# Patient Record
Sex: Male | Born: 1990 | Race: White | Hispanic: No | Marital: Single | Smoking: Never smoker
Health system: Southern US, Community
[De-identification: ages and names within clinical notes are randomized; demographics above are authoritative.]

## PROBLEM LIST (undated history)

## (undated) DIAGNOSIS — S8990XA Unspecified injury of unspecified lower leg, initial encounter: Secondary | ICD-10-CM

## (undated) DIAGNOSIS — F988 Other specified behavioral and emotional disorders with onset usually occurring in childhood and adolescence: Secondary | ICD-10-CM

## (undated) HISTORY — DX: Unspecified injury of unspecified lower leg, initial encounter: S89.90XA

---

## 2012-08-13 ENCOUNTER — Emergency Department: Payer: Self-pay | Admitting: Emergency Medicine

## 2014-12-23 ENCOUNTER — Telehealth: Payer: Self-pay | Admitting: Family Medicine
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
## 2014-12-23 NOTE — Telephone Encounter (Signed)
Add him on.  if possible. Thanks.

## 2014-12-23 NOTE — Telephone Encounter (Signed)
Scheduled 12/26/2014 @ 11:30 a.m.

## 2014-12-26 ENCOUNTER — Ambulatory Visit (INDEPENDENT_AMBULATORY_CARE_PROVIDER_SITE_OTHER): Payer: 59 | Admitting: Family Medicine

## 2014-12-26 ENCOUNTER — Encounter (INDEPENDENT_AMBULATORY_CARE_PROVIDER_SITE_OTHER): Payer: Self-pay

## 2014-12-26 ENCOUNTER — Encounter: Payer: Self-pay | Admitting: Family Medicine

## 2014-12-26 VITALS — BP 102/70 | HR 83 | Temp 99.1°F | Ht 74.0 in | Wt 241.8 lb

## 2014-12-26 DIAGNOSIS — S81812D Laceration without foreign body, left lower leg, subsequent encounter: Secondary | ICD-10-CM
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
## 2014-12-26 NOTE — Patient Instructions (Signed)
Change dressing daily.  Recheck at the end of next week- suture removal.  If any fevers, draining pus, more pain, then notify us.   Take care.  Glad to see you.

## 2014-12-27 ENCOUNTER — Encounter: Payer: Self-pay | Admitting: Family Medicine

## 2014-12-27 DIAGNOSIS — S81819A Laceration without foreign body, unspecified lower leg, initial encounter: Secondary | ICD-10-CM | POA: Insufficient documentation
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
## 2015-01-03 ENCOUNTER — Ambulatory Visit (INDEPENDENT_AMBULATORY_CARE_PROVIDER_SITE_OTHER): Payer: Self-pay | Admitting: Family Medicine

## 2015-01-03 ENCOUNTER — Encounter: Payer: Self-pay | Admitting: Family Medicine

## 2015-01-03 VITALS — BP 110/70 | HR 93 | Temp 98.1°F | Wt 244.5 lb

## 2015-01-03 DIAGNOSIS — S81812D Laceration without foreign body, left lower leg, subsequent encounter: Secondary | ICD-10-CM

## 2015-01-03 NOTE — Patient Instructions (Signed)
Take care.  Glad to see you.  No charge for visit.
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
## 2015-01-05 NOTE — Assessment & Plan Note (Signed)
9 vertical matress sutures removed w/o complication.  Wound still with good tissue apposition.  Reinforced with steri strips with routine cautions.  F/u prn.  He agrees.  No charge.

## 2019-11-07 ENCOUNTER — Telehealth: Payer: Self-pay | Admitting: Family Medicine

## 2019-11-07 NOTE — Telephone Encounter (Signed)
Yes, please, glad to see him.  Please schedule 30-minute appointment.  We can do labs at the visit if needed.  Thanks.

## 2019-11-07 NOTE — Telephone Encounter (Signed)
Patient called to schedule annual visit. Patient has not been seen since 2016 and would like to rest care with you.   Is this okay ?

## 2019-11-08 ENCOUNTER — Telehealth: Payer: Self-pay | Admitting: Family Medicine

## 2019-11-08 NOTE — Telephone Encounter (Signed)
Called patient and left voicemail for him to call the office to get scheduled for 30 minute OV.

## 2019-11-19 ENCOUNTER — Other Ambulatory Visit: Payer: Self-pay

## 2019-11-19 ENCOUNTER — Ambulatory Visit: Payer: 59 | Admitting: Family Medicine

## 2019-11-19 ENCOUNTER — Encounter: Payer: Self-pay | Admitting: Family Medicine

## 2019-11-19 ENCOUNTER — Ambulatory Visit (INDEPENDENT_AMBULATORY_CARE_PROVIDER_SITE_OTHER)
Admission: RE | Admit: 2019-11-19 | Discharge: 2019-11-19 | Disposition: A | Discharge status: Home / Self Care | Payer: 59 | Source: Ambulatory Visit | Attending: Family Medicine | Admitting: Family Medicine

## 2019-11-19 VITALS — BP 122/84 | HR 97 | Temp 96.5°F | Ht 73.5 in | Wt 289.0 lb

## 2019-11-19 DIAGNOSIS — R29898 Other symptoms and signs involving the musculoskeletal system: Secondary | ICD-10-CM

## 2019-11-19 DIAGNOSIS — M24831 Other specific joint derangements of right wrist, not elsewhere classified: Secondary | ICD-10-CM

## 2019-11-19 DIAGNOSIS — R41840 Attention and concentration deficit: Secondary | ICD-10-CM

## 2019-11-19 DIAGNOSIS — H61899 Other specified disorders of external ear, unspecified ear: Secondary | ICD-10-CM

## 2019-11-19 MED ORDER — ACETIC ACID 2 % OT SOLN: 4.0000 [drp] | Freq: Every day | OTIC | 0 refills | Status: DC | PRN
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
## 2019-11-19 NOTE — Patient Instructions (Signed)
Use the ear drop as needed.  Avoid qtips.   Go to the lab on the way out.   If you have mychart we'll likely use that to update you.     Try a lace up ankle brace when weight bearing.    Ask the dentist about a mouth guard.   Let me see about testing in general and we'll go from there.   Take care.  Glad to see you.

## 2019-11-22 DIAGNOSIS — M24831 Other specific joint derangements of right wrist, not elsewhere classified: Secondary | ICD-10-CM | POA: Insufficient documentation

## 2019-11-22 DIAGNOSIS — H61899 Other specified disorders of external ear, unspecified ear: Secondary | ICD-10-CM | POA: Insufficient documentation

## 2019-11-22 DIAGNOSIS — R29898 Other symptoms and signs involving the musculoskeletal system: Secondary | ICD-10-CM | POA: Insufficient documentation

## 2019-11-22 DIAGNOSIS — F988 Other specified behavioral and emotional disorders with onset usually occurring in childhood and adolescence: Secondary | ICD-10-CM | POA: Insufficient documentation

## 2019-11-22 NOTE — Assessment & Plan Note (Signed)
Likely an intermittent issue for the patient.  Would avoid using Q-tips in the meantime and he can use acetic acid otic solution if needed, if he has return of symptoms.  Hold for now.  He agrees.

## 2019-11-22 NOTE — Assessment & Plan Note (Signed)
He has been able to compensate for previous difficulty concentrating but as his work requirements have gone up its been more of an issue.  Unclear if this is a concentration deficit as a primary issue or something else is contributing.  We talked about getting testing set up through psychology.  That is a reasonable place to start.  He agrees.

## 2019-11-22 NOTE — Assessment & Plan Note (Signed)
He also has noisy range of motion of the right ankle.  I think the ankle issues are related to the tendon sheath and it is reasonable to use a lace up ankle brace in the meantime when weightbearing and then update me as needed.  We opted to x-ray his wrist to make sure there was not any other obvious bony pathology.  See notes on imaging.

## 2019-11-22 NOTE — Assessment & Plan Note (Signed)
I asked him to check with his dentist to see about options and avoid chewing gum/triggers in the meantime.

## 2020-04-10 ENCOUNTER — Ambulatory Visit (INDEPENDENT_AMBULATORY_CARE_PROVIDER_SITE_OTHER): Payer: 59 | Admitting: Psychology

## 2020-04-10 DIAGNOSIS — F909 Attention-deficit hyperactivity disorder, unspecified type: Secondary | ICD-10-CM

## 2020-06-23 ENCOUNTER — Ambulatory Visit: Payer: 59 | Admitting: Psychology

## 2020-06-24 DIAGNOSIS — F900 Attention-deficit hyperactivity disorder, predominantly inattentive type: Secondary | ICD-10-CM

## 2020-06-30 ENCOUNTER — Telehealth: Payer: Self-pay | Admitting: Family Medicine

## 2020-06-30 NOTE — Telephone Encounter (Signed)
Patient is scheduled to come in to discuss ADHD medication/treatment on 07/03/20 at 8:00 am.

## 2020-06-30 NOTE — Telephone Encounter (Signed)
Pt called in he was just diagnosed with ADHD and wanted to know if he needs to make and appointment to see

## 2020-06-30 NOTE — Telephone Encounter (Signed)
Patient was dx with ADHD last Monday with Dr. Reggy Eye. He was not given any medications at this time; said he needed to get medications from [place] if needed.

## 2020-07-03 ENCOUNTER — Other Ambulatory Visit: Payer: Self-pay

## 2020-07-03 ENCOUNTER — Encounter: Payer: Self-pay | Admitting: Family Medicine

## 2020-07-03 ENCOUNTER — Ambulatory Visit: Payer: 59 | Admitting: Family Medicine

## 2020-07-03 DIAGNOSIS — R41840 Attention and concentration deficit: Secondary | ICD-10-CM | POA: Diagnosis not present

## 2020-07-03 MED ORDER — AMPHETAMINE-DEXTROAMPHETAMINE 5 MG PO TABS: ORAL_TABLET | ORAL | 0 refills | Status: DC

## 2020-07-03 NOTE — Progress Notes (Signed)
This visit occurred during the SARS-CoV-2 public health emergency.  Safety protocols were in place, including screening questions prior to the visit, additional usage of staff PPE, and extensive cleaning of exam room while observing appropriate contact time as indicated for disinfecting solutions.  He changed jobs in the meantime.  D/w pt.   He went for ADD testing.  Confirmed dx.  He noted sx in high school.  He has more sx with complicated or multiple tasks.  He'll start to clear or reorganize, away from the project at hand.  We talked about nonpharm interventions to help.  He is already making lists, etc.    Meds, vitals, and allergies reviewed.   ROS: Per HPI unless specifically indicated in ROS section   GEN: nad, alert and oriented HEENT: ncat NECK: supple w/o LA CV: rrr.  PULM: ctab, no inc wob ABD: soft, +bs EXT: no edema SKIN: no acute rash No tremor.

## 2020-07-03 NOTE — Patient Instructions (Signed)
Start with 5mg  a day, gradually increasing up to 10mg  twice a day if needed.  If troubles on med, then stop it and let me know.  Otherwise update me in about 2 weeks.  Take care.  Glad to see you.

## 2020-07-06 NOTE — Assessment & Plan Note (Signed)
Discussed options.  Discussed nonpharmacologic interventions, which she is already using with list, etc.  Discussed stimulant versus nonstimulant medications and risk/benefits associated with them. Stimulant prescription cautions, controlled medication cautions discussed with patient.  He is aware of regulations associated with controlled medications.   Reasonable to start Adderall. Start with 5mg  a day, gradually increasing up to 10mg  twice a day if needed.  If troubles on med, then stop it and let me know.  Otherwise update me in about 2 weeks.   Okay for outpatient follow-up.  He agrees with plan.

## 2020-09-25 ENCOUNTER — Telehealth: Payer: Self-pay | Admitting: Family Medicine

## 2020-09-25 NOTE — Telephone Encounter (Signed)
LAST APPOINTMENT DATE: 07/03/2020   NEXT APPOINTMENT DATE: Visit date not found    LAST REFILL: 07/03/2020  QTY: 1-2 tab bid  No UDS or contract in file

## 2020-09-25 NOTE — Telephone Encounter (Signed)
Pt called in wanted to know about getting a new prescription for Adderall.  Pharmacy - [facility]- [address]

## 2020-09-26 MED ORDER — AMPHETAMINE-DEXTROAMPHETAMINE 5 MG PO TABS: ORAL_TABLET | ORAL | 0 refills | Status: DC

## 2020-09-26 NOTE — Telephone Encounter (Signed)
Sent. Thanks.   

## 2020-11-21 ENCOUNTER — Other Ambulatory Visit: Payer: Self-pay

## 2020-11-21 ENCOUNTER — Ambulatory Visit
Admission: RE | Admit: 2020-11-21 | Discharge: 2020-11-21 | Disposition: A | Discharge status: Home / Self Care | Payer: 59 | Source: Ambulatory Visit | Attending: Emergency Medicine | Admitting: Emergency Medicine

## 2020-11-21 VITALS — BP 124/73 | HR 76 | Temp 98.6°F | Resp 18

## 2020-11-21 DIAGNOSIS — L237 Allergic contact dermatitis due to plants, except food: Secondary | ICD-10-CM | POA: Diagnosis not present

## 2020-11-21 HISTORY — DX: Other specified behavioral and emotional disorders with onset usually occurring in childhood and adolescence: F98.8

## 2020-11-21 MED ORDER — METHYLPREDNISOLONE SODIUM SUCC 125 MG IJ SOLR
80.0000 mg | Freq: Once | INTRAMUSCULAR | Status: AC
  Administered 2020-11-21: 80 mg via INTRAMUSCULAR

## 2020-11-21 MED ORDER — PREDNISONE 10 MG (21) PO TBPK: ORAL_TABLET | Freq: Every day | ORAL | 0 refills | Status: DC

## 2020-11-21 NOTE — ED Triage Notes (Signed)
Pt presents with possible poison ivy on bilateral wrists, neck, some torso, couple spots on face, etc.  Started two days ago after clearing property.  Has used hydrocortisone cream with little relief.  Has not used benadryl.

## 2020-11-21 NOTE — Discharge Instructions (Addendum)
You were given an injection of a steroid called Solu-Medrol.  Start the prednisone taper tomorrow as directed.    Take Benadryl every 6 hours as directed; do not drive, operate machinery, or drink alcohol with this medication as it may cause drowsiness.  If you need to be awake and alert, take Claritin as directed.    Follow up with your primary care provider if your symptoms are not improving.       
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
## 2020-11-28 ENCOUNTER — Telehealth: Payer: Self-pay | Admitting: Family Medicine

## 2020-11-28 NOTE — Telephone Encounter (Signed)
Last refilled 09/26/20 #120/0

## 2020-11-28 NOTE — Telephone Encounter (Signed)
  LAST APPOINTMENT DATE: 09/25/2020   NEXT APPOINTMENT DATE:@Visit  date not found  MEDICATION: amphetamine-dextroamphetamine (ADDERALL) 5 MG tablet  [facility]- [address]   Let patient know to contact pharmacy at the end of the day to make sure medication is ready.  Please notify patient to allow 48-72 hours to process  Encourage patient to contact the pharmacy for refills or they can request refills through [facility]  CLINICAL FILLS OUT ALL BELOW:   LAST REFILL:  QTY:  REFILL DATE:    OTHER COMMENTS:    Okay for refill?  Please advise

## 2020-11-30 MED ORDER — AMPHETAMINE-DEXTROAMPHETAMINE 5 MG PO TABS: ORAL_TABLET | ORAL | 0 refills | Status: DC

## 2020-11-30 NOTE — Addendum Note (Signed)
Addended by: Joaquim Nam on: 11/30/2020 12:39 PM   Modules accepted: Orders

## 2020-11-30 NOTE — Telephone Encounter (Signed)
Sent. Thanks.   

## 2021-01-11 ENCOUNTER — Other Ambulatory Visit: Payer: Self-pay | Admitting: Family Medicine

## 2021-01-11 DIAGNOSIS — R41840 Attention and concentration deficit: Secondary | ICD-10-CM

## 2021-01-13 ENCOUNTER — Other Ambulatory Visit: Payer: Self-pay

## 2021-01-13 ENCOUNTER — Other Ambulatory Visit (INDEPENDENT_AMBULATORY_CARE_PROVIDER_SITE_OTHER): Payer: 59

## 2021-01-13 DIAGNOSIS — R41840 Attention and concentration deficit: Secondary | ICD-10-CM

## 2021-01-13 LAB — BASIC METABOLIC PANEL
BUN: 10 mg/dL (ref 6–23)
CO2: 27 mEq/L (ref 19–32)
Calcium: 9.5 mg/dL (ref 8.4–10.5)
Chloride: 104 mEq/L (ref 96–112)
Creatinine, Ser: 1.1 mg/dL (ref 0.40–1.50)
GFR: 90.62 mL/min (ref >60.00)
Glucose, Bld: 84 mg/dL (ref 70–99)
Potassium: 3.7 mEq/L (ref 3.5–5.1)
Sodium: 138 mEq/L (ref 135–145)

## 2021-01-20 ENCOUNTER — Other Ambulatory Visit: Payer: Self-pay

## 2021-01-20 ENCOUNTER — Ambulatory Visit (INDEPENDENT_AMBULATORY_CARE_PROVIDER_SITE_OTHER): Payer: 59 | Admitting: Family Medicine

## 2021-01-20 ENCOUNTER — Encounter: Payer: Self-pay | Admitting: Family Medicine

## 2021-01-20 VITALS — BP 110/78 | HR 82 | Temp 97.2°F | Ht 74.0 in | Wt 242.0 lb

## 2021-01-20 DIAGNOSIS — Z7189 Other specified counseling: Secondary | ICD-10-CM

## 2021-01-20 DIAGNOSIS — Z0000 Encounter for general adult medical examination without abnormal findings: Secondary | ICD-10-CM

## 2021-01-20 DIAGNOSIS — F988 Other specified behavioral and emotional disorders with onset usually occurring in childhood and adolescence: Secondary | ICD-10-CM

## 2021-01-20 MED ORDER — AMPHETAMINE-DEXTROAMPHETAMINE 5 MG PO TABS: ORAL_TABLET | ORAL | 0 refills | Status: DC

## 2021-01-20 NOTE — Progress Notes (Signed)
This visit occurred during the SARS-CoV-2 public health emergency.  Safety protocols were in place, including screening questions prior to the visit, additional usage of staff PPE, and extensive cleaning of exam room while observing appropriate contact time as indicated for disinfecting solutions.  CPE- See plan.  Routine anticipatory guidance given to patient.  See health maintenance.  The possibility exists that previously documented standard health maintenance information may have been brought forward from a previous encounter into this note.  If needed, that same information has been updated to reflect the current situation based on today's encounter.    Tetanus 2016 Covid prev done Flu shot to be done this fall.   PNA and shingles not due.  Colon and prostate cancer screening d/w pt.   Mother designated if patient were incapacitated.   Diet and exercise d/w pt.  Working on both.    Intentional weight loss noted.  Recent labs discussed with patient.  He is going to see dermatology for a routine skin check tomorrow.   Concentration deficit d/w pt.  Adderall helped and he notes a sig change off med.  No ADE on med, no tremor, no appetite changes, no insomnia.   PMH and SH reviewed  Meds, vitals, and allergies reviewed.  ROS: Per HPI.  Unless specifically indicated otherwise in HPI, the patient denies:  General: fever. Eyes: acute vision changes ENT: sore throat Cardiovascular: chest pain Respiratory: SOB GI: vomiting GU: dysuria Musculoskeletal: acute back pain Derm: acute rash Neuro: acute motor dysfunction Psych: worsening mood Endocrine: polydipsia Heme: bleeding Allergy: hayfever  GEN: nad, alert and oriented HEENT: ncat NECK: supple w/o LA CV: rrr. PULM: ctab, no inc wob ABD: soft, +bs EXT: no edema SKIN: no acute rash but mult benign appearing nevi noted on the back.

## 2021-01-20 NOTE — Patient Instructions (Signed)
I would get a flu shot each fall.   Take care.  Glad to see you.  Update me as needed.  

## 2021-01-21 DIAGNOSIS — Z0000 Encounter for general adult medical examination without abnormal findings: Secondary | ICD-10-CM | POA: Insufficient documentation

## 2021-01-21 DIAGNOSIS — Z7189 Other specified counseling: Secondary | ICD-10-CM | POA: Insufficient documentation

## 2021-01-21 NOTE — Assessment & Plan Note (Signed)
Tetanus 2016 Covid prev done Flu shot to be done this fall.   PNA and shingles not due.  Colon and prostate cancer screening d/w pt.   Mother designated if patient were incapacitated.   Diet and exercise d/w pt.  Working on both.

## 2021-01-21 NOTE — Assessment & Plan Note (Signed)
Continue Adderall.  Continue making lists, etc.  He is tolerating medication.  He will update me as needed.  It helped his situation.

## 2021-01-21 NOTE — Assessment & Plan Note (Signed)
Mother designated if patient were incapacitated. 

## 2021-04-06 ENCOUNTER — Telehealth: Payer: Self-pay | Admitting: Family Medicine

## 2021-04-06 MED ORDER — AMPHETAMINE-DEXTROAMPHETAMINE 5 MG PO TABS: ORAL_TABLET | ORAL | 0 refills | Status: DC

## 2021-04-06 NOTE — Telephone Encounter (Signed)
  Encourage patient to contact the pharmacy for refills or they can request refills through [facility]  LAST APPOINTMENT DATE:  Please schedule appointment if longer than 1 year  NEXT APPOINTMENT DATE:no future appt  MEDICATION:amphetamine-dextroamphetamine (ADDERALL) 5 MG tablet  Is the patient out of medication? 1 tablet left  PHARMACY:[id number] - [place], [place] - [address]  Let patient know to contact pharmacy at the end of the day to make sure medication is ready.  Please notify patient to allow 48-72 hours to process  CLINICAL FILLS OUT ALL BELOW:   LAST REFILL:  QTY:  REFILL DATE:    OTHER COMMENTS:    Okay for refill?  Please advise

## 2021-04-06 NOTE — Telephone Encounter (Signed)
Sent. Thanks.   

## 2021-04-06 NOTE — Addendum Note (Signed)
Addended by: Joaquim Nam on: 04/06/2021 01:50 PM   Modules accepted: Orders

## 2021-04-06 NOTE — Telephone Encounter (Signed)
LOV - 01/20/21 NOV - not scheduled RF - 01/20/21 #120/0

## 2021-04-10 ENCOUNTER — Telehealth: Payer: Self-pay | Admitting: Family Medicine

## 2021-04-10 MED ORDER — AMPHETAMINE-DEXTROAMPHETAMINE 5 MG PO TABS: ORAL_TABLET | ORAL | 0 refills | Status: DC

## 2021-04-10 NOTE — Telephone Encounter (Signed)
Sent. Thanks.   

## 2021-04-10 NOTE — Telephone Encounter (Signed)
[facility] is out of rx and now [facility]

## 2021-04-10 NOTE — Telephone Encounter (Signed)
  Encourage patient to contact the pharmacy for refills or they can request refills through [facility]  LAST APPOINTMENT DATE:  Please schedule appointment if longer than 1 year  NEXT APPOINTMENT DATE:No Future Appt  MEDICATION:amphetamine-dextroamphetamine (ADDERALL) 5 MG tablet  Is the patient out of medication? YES  PHARMACY: [facility] in [place]  Let patient know to contact pharmacy at the end of the day to make sure medication is ready.  Please notify patient to allow 48-72 hours to process  CLINICAL FILLS OUT ALL BELOW:   LAST REFILL:  QTY:  REFILL DATE:    OTHER COMMENTS:    Okay for refill?  Please advise

## 2021-04-10 NOTE — Addendum Note (Signed)
Addended by: Joaquim Nam on: 04/10/2021 05:19 PM   Modules accepted: Orders

## 2021-04-11 ENCOUNTER — Encounter: Payer: Self-pay | Admitting: Emergency Medicine

## 2021-04-11 ENCOUNTER — Ambulatory Visit
Admission: EM | Admit: 2021-04-11 | Discharge: 2021-04-11 | Disposition: A | Discharge status: Home / Self Care | Payer: 59 | Attending: Emergency Medicine | Admitting: Emergency Medicine

## 2021-04-11 ENCOUNTER — Other Ambulatory Visit: Payer: Self-pay

## 2021-04-11 DIAGNOSIS — L237 Allergic contact dermatitis due to plants, except food: Secondary | ICD-10-CM | POA: Diagnosis not present

## 2021-04-11 MED ORDER — PREDNISONE 10 MG (21) PO TBPK: ORAL_TABLET | Freq: Every day | ORAL | 0 refills | Status: DC

## 2021-04-11 MED ORDER — METHYLPREDNISOLONE SODIUM SUCC 125 MG IJ SOLR
80.0000 mg | Freq: Once | INTRAMUSCULAR | Status: AC
  Administered 2021-04-11: 80 mg via INTRAMUSCULAR
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
## 2021-04-11 NOTE — ED Triage Notes (Signed)
Pt here with poison ivy x 4 days on both hands and right axilla.

## 2021-04-11 NOTE — Discharge Instructions (Addendum)
You were given an injection of a steroid called Solu-Medrol.  Start the prednisone taper tomorrow as directed.    Take Benadryl every 6 hours as directed; do not drive, operate machinery, or drink alcohol with this medication as it may cause drowsiness.    Follow up with your primary care provider if your symptoms are not improving.

## 2021-05-26 ENCOUNTER — Telehealth: Payer: Self-pay | Admitting: Family Medicine

## 2021-05-26 NOTE — Telephone Encounter (Signed)
°  Encourage patient to contact the pharmacy for refills or they can request refills through [facility]  LAST APPOINTMENT DATE:  Please schedule appointment if longer than 1 year  NEXT APPOINTMENT DATE:No future appts  MEDICATION:amphetamine-dextroamphetamine (ADDERALL) 5 MG tablet  Is the patient out of medication? NO  PHARMACY:[facility] - [place], [place] - [address]  Let patient know to contact pharmacy at the end of the day to make sure medication is ready.  Please notify patient to allow 48-72 hours to process  CLINICAL FILLS OUT ALL BELOW:   LAST REFILL:  QTY:  REFILL DATE:    OTHER COMMENTS:    Okay for refill?  Please advise

## 2021-05-26 NOTE — Telephone Encounter (Signed)
LOV - 01/20/21 NOV - not scheduled RF- 04/10/21 #120/0

## 2021-05-27 MED ORDER — AMPHETAMINE-DEXTROAMPHETAMINE 5 MG PO TABS: ORAL_TABLET | ORAL | 0 refills | Status: DC

## 2021-05-27 NOTE — Telephone Encounter (Signed)
Sent. Thanks.   

## 2021-05-27 NOTE — Addendum Note (Signed)
Addended by: Joaquim Nam on: 05/27/2021 11:57 AM   Modules accepted: Orders

## 2021-06-01 ENCOUNTER — Telehealth: Payer: Self-pay | Admitting: Family Medicine

## 2021-06-01 MED ORDER — AMPHETAMINE-DEXTROAMPHETAMINE 5 MG PO TABS: ORAL_TABLET | ORAL | 0 refills | Status: DC

## 2021-06-01 NOTE — Addendum Note (Signed)
Addended by: Joaquim Nam on: 06/01/2021 04:41 PM   Modules accepted: Orders

## 2021-06-01 NOTE — Telephone Encounter (Signed)
Sent. Thanks.   

## 2021-06-01 NOTE — Telephone Encounter (Signed)
Patient states rx for adderall was sent to wrong pharmacy last week. He needs the rx to be sent to [facility]. I have updated his pharmacy lists with correct pharmacy and called [facility] and cancelled rx. [facility].

## 2021-06-01 NOTE — Telephone Encounter (Signed)
°  Encourage patient to contact the pharmacy for refills or they can request refills through [facility]  LAST APPOINTMENT DATE:  Please schedule appointment if longer than 1 year  NEXT APPOINTMENT DATE:NO FUTURE APPT  MEDICATION:amphetamine-dextroamphetamine (ADDERALL) 5 MG tablet  Is the patient out of medication?   PHARMACY:[facility] - [place], [place] - [address]  Let patient know to contact pharmacy at the end of the day to make sure medication is ready.  Please notify patient to allow 48-72 hours to process  CLINICAL FILLS OUT ALL BELOW:   LAST REFILL:  QTY:  REFILL DATE:    OTHER COMMENTS:    Okay for refill?  Please advise

## 2021-06-16 ENCOUNTER — Telehealth: Payer: Self-pay | Admitting: Family Medicine

## 2021-06-16 NOTE — Telephone Encounter (Signed)
April with [facility] called asking for clinical notes along with the Clinic Criteria Form for pt that she faxed over to be faxed to [phone]. Please advise.

## 2021-06-16 NOTE — Telephone Encounter (Signed)
I don't have or recall seeing anything about this.  Please have them refax if needed.

## 2021-06-16 NOTE — Telephone Encounter (Signed)
Dr Para March have you seen anything on this? I do not see it in the current faxes sent to [place]

## 2021-06-17 NOTE — Telephone Encounter (Signed)
Called and left message on Aprils VM that we have not received anything for this patient after I started PA in covermymeds. I asked that she please refax whatever was sent and we can get this done for the patient. Will wait for ppw to come thru.

## 2021-06-18 NOTE — Telephone Encounter (Signed)
OV notes have been faxed.

## 2021-06-23 NOTE — Telephone Encounter (Signed)
PA was denied. They will not cover the quantity that is being prescribed; will only cover 30 caps per 30 days. If patient needs higher dose then something else will need to be prescribed.

## 2021-06-24 NOTE — Telephone Encounter (Signed)
Please check with patient.  We can try changing him to a longer acting single day dose.  I want to make sure he is okay with the transition.  Otherwise let him know the PA was denied.  Thanks.

## 2021-06-25 NOTE — Telephone Encounter (Signed)
LMTCB

## 2021-06-26 NOTE — Telephone Encounter (Signed)
Pt returning your call

## 2021-06-26 NOTE — Telephone Encounter (Signed)
Spoke with patient and explained everything to him. He is okay with changing to an extended release or whatever Dr. Para March is wanting to change rx to for insurance to cover his prescription.

## 2021-06-28 MED ORDER — AMPHETAMINE-DEXTROAMPHET ER 20 MG PO CP24: 20.0000 mg | ORAL_CAPSULE | ORAL | 0 refills | Status: DC

## 2021-06-28 NOTE — Telephone Encounter (Addendum)
Would try adderall xr 20mg  a day, taken in the AM.  Rx sent.  Please let me know if he doesn't have good effect, if he has a problem taking the med, or any other concern re: med.  Thanks.

## 2021-06-28 NOTE — Addendum Note (Signed)
Addended by: Tonia Ghent on: 06/28/2021 06:27 PM   Modules accepted: Orders

## 2021-06-29 NOTE — Telephone Encounter (Signed)
Called and left patient a vm about new rx being sent in for him. Advised if he needs anything to please call back.

## 2021-07-30 ENCOUNTER — Telehealth: Payer: Self-pay | Admitting: Family Medicine

## 2021-07-30 NOTE — Telephone Encounter (Signed)
Refill request for amphetamine-dextroamphetamine (ADDERALL XR) 20 MG 24 hr capsule ? ?LOV - 01/20/21 ?Next OV - not scheduled ?Last refill - 06/28/21 #30/0 ? ?

## 2021-07-30 NOTE — Telephone Encounter (Signed)
Patient called back states leaving to go out of town in the morning at 5am wanted to follow up on refill. Reminded patient that we request 72 hours for refill.  ?

## 2021-07-31 MED ORDER — AMPHETAMINE-DEXTROAMPHET ER 20 MG PO CP24: 20.0000 mg | ORAL_CAPSULE | ORAL | 0 refills | Status: DC

## 2021-07-31 NOTE — Telephone Encounter (Signed)
I am just getting to this refill.  I sent it in the meantime.  I could not do it during business hours yesterday due to continuous patient care. ?

## 2021-08-07 NOTE — Telephone Encounter (Signed)
Patient is calling to transfer Adderrall to [facility] in [place]. Patient would like a call to know it has been transferred

## 2021-08-09 MED ORDER — AMPHETAMINE-DEXTROAMPHET ER 20 MG PO CP24: 20.0000 mg | ORAL_CAPSULE | ORAL | 0 refills | Status: DC

## 2021-08-09 NOTE — Addendum Note (Signed)
Addended by: Tonia Ghent on: 08/09/2021 06:27 PM ? ? Modules accepted: Orders ? ?

## 2021-08-09 NOTE — Telephone Encounter (Signed)
Done. Thanks. Let me know if he has trouble getting it filled. ?

## 2021-08-10 ENCOUNTER — Telehealth: Payer: Self-pay | Admitting: Family Medicine

## 2021-08-10 MED ORDER — AMPHETAMINE-DEXTROAMPHET ER 20 MG PO CP24: 20.0000 mg | ORAL_CAPSULE | ORAL | 0 refills | Status: DC

## 2021-08-10 NOTE — Telephone Encounter (Signed)
Patient has been notified about rx 

## 2021-08-10 NOTE — Telephone Encounter (Signed)
?  Encourage patient to contact the pharmacy for refills or they can request refills through [facility] ? ?LAST APPOINTMENT DATE:  Please schedule appointment if longer than 1 year ? ?NEXT APPOINTMENT DATE: ? ?MEDICATION:amphetamine-dextroamphetamine (ADDERALL XR) 20 MG 24 hr capsule ? ?Is the patient out of medication?  ? ?PHARMACY:[facility] [address] [place] [address] ? ?Let patient know to contact pharmacy at the end of the day to make sure medication is ready. ? ?Please notify patient to allow 48-72 hours to process ? ?CLINICAL FILLS OUT ALL BELOW:  ? ?LAST REFILL: ? ?QTY: ? ?REFILL DATE: ? ? ? ?OTHER COMMENTS:  ? ? ?Okay for refill? ? ?Please advise ? ? ?  ?

## 2021-08-10 NOTE — Telephone Encounter (Signed)
Filbert Berthold, CMA  You 1 hour ago (12:48 PM)  ? ?[facility] ran out of rx before he could get his filled; please send to new pharmacy listed   ?================= ?Rx sent.  Thanks.  ?

## 2021-08-10 NOTE — Telephone Encounter (Signed)
Patient notified rx was sent 

## 2021-08-13 MED ORDER — AMPHETAMINE-DEXTROAMPHET ER 20 MG PO CP24: 20.0000 mg | ORAL_CAPSULE | ORAL | 0 refills | Status: DC

## 2021-08-13 NOTE — Telephone Encounter (Signed)
Patient called and stated Samuel Bernard has not gotten his script, patient is completely out as of today ? ?Please forward script to [name] ?

## 2021-08-13 NOTE — Addendum Note (Signed)
Addended by: Joaquim Nam on: 08/13/2021 02:10 PM ? ? Modules accepted: Orders ? ?

## 2021-08-13 NOTE — Telephone Encounter (Signed)
Sent.  Please verify that [facility] got the rx.  Thanks.  ?

## 2021-08-13 NOTE — Telephone Encounter (Signed)
Called and verified that [facility] received the rx and they did. Called and let patient know.  ?

## 2021-09-07 ENCOUNTER — Other Ambulatory Visit: Payer: Self-pay | Admitting: Family Medicine

## 2021-09-07 NOTE — Telephone Encounter (Signed)
Last written 08-13-21 #30 ?Last OV/CPE 01-20-21 ?No Future OV ?[place] ?

## 2021-09-08 MED ORDER — AMPHETAMINE-DEXTROAMPHET ER 20 MG PO CP24: 20.0000 mg | ORAL_CAPSULE | ORAL | 0 refills | Status: DC

## 2021-09-16 IMAGING — DX DG WRIST COMPLETE 3+V*R*
4 series · 4 of 4 positions shown · non-contrast
Comparison: None.

CLINICAL DATA: Wrist crepitus.

EXAM:
RIGHT WRIST - COMPLETE 3+ VIEW

[wrist ap]
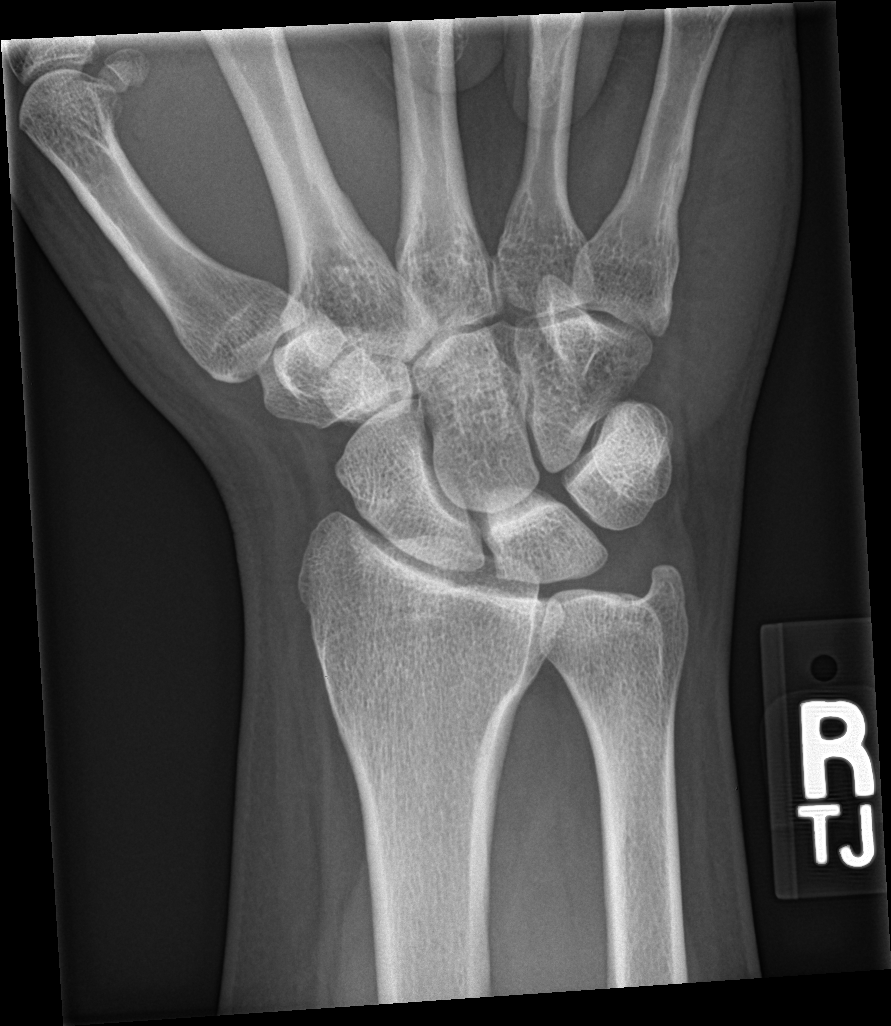

[wrist obl]
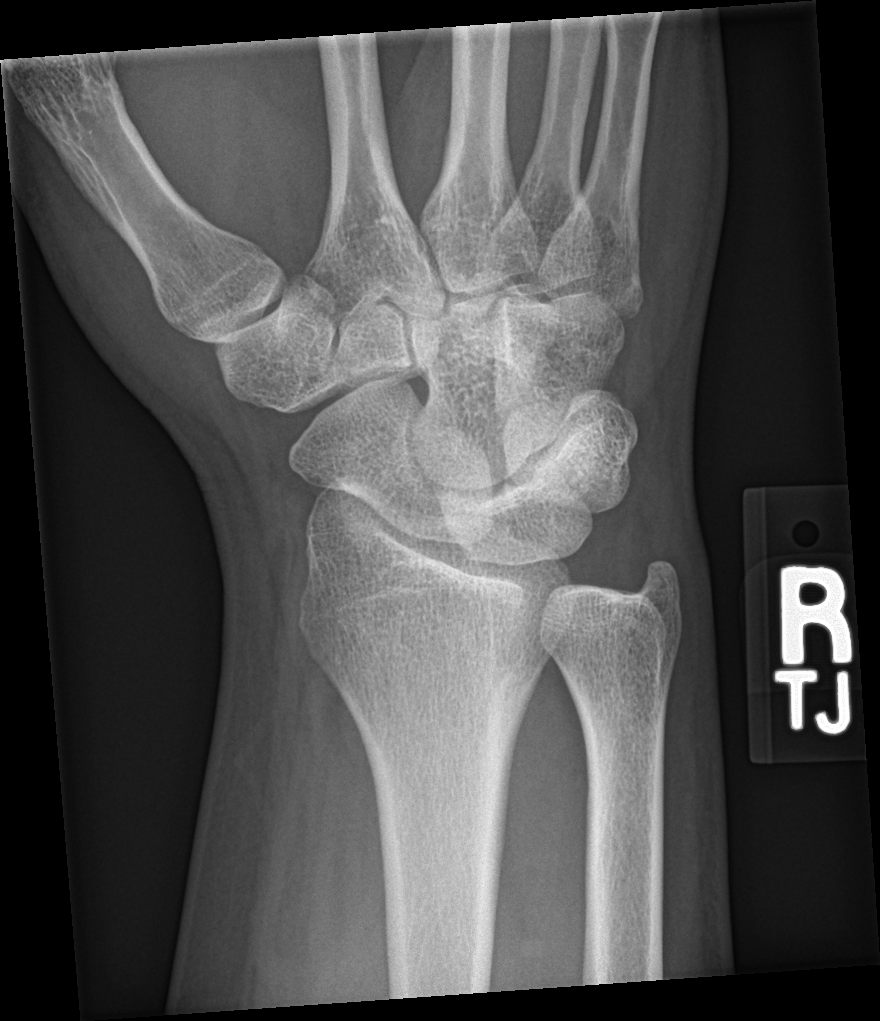

[wrist lat]
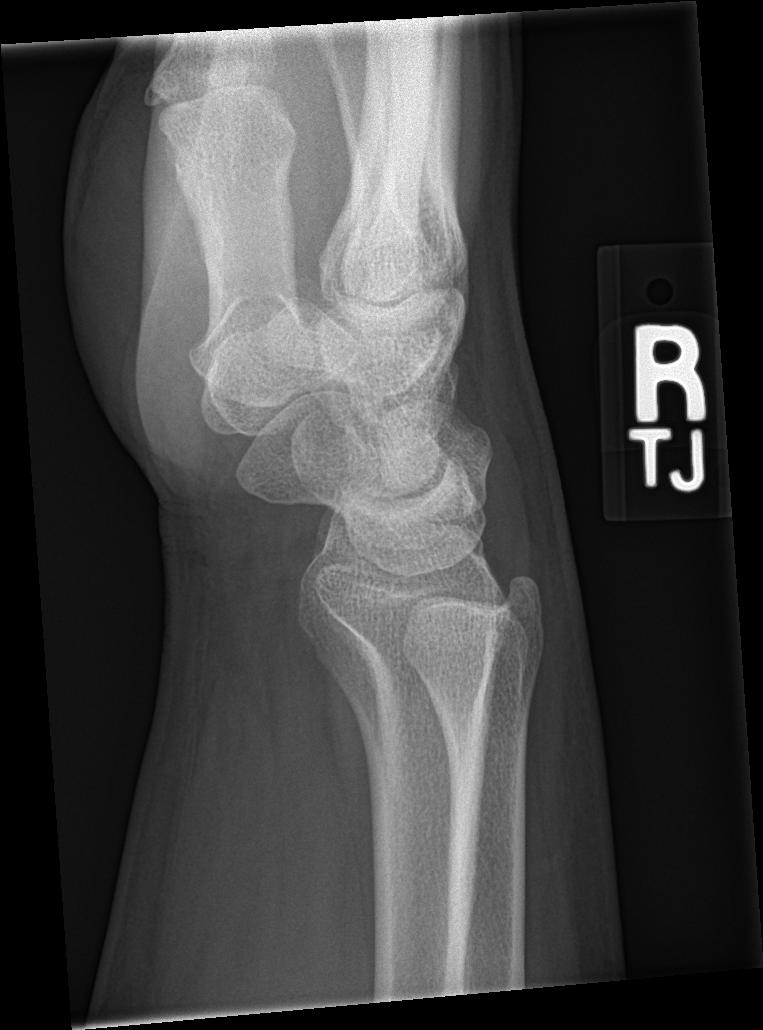

[wrist pa]
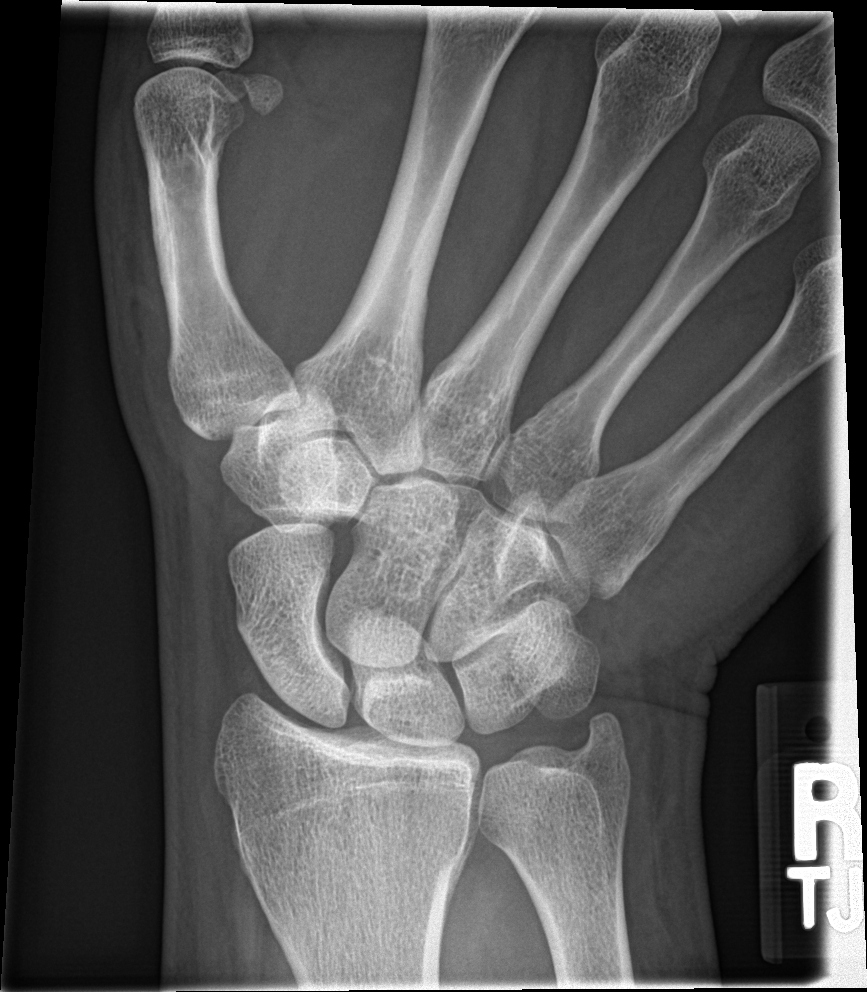

[4 of 4 positions shown; findings below may reference images not displayed]

FINDINGS: There is no evidence of fracture or dislocation. There is no
evidence of arthropathy or other focal bone abnormality. Soft
tissues are unremarkable.
IMPRESSION: Negative.

## 2021-10-08 ENCOUNTER — Other Ambulatory Visit: Payer: Self-pay | Admitting: Family Medicine

## 2021-10-08 NOTE — Telephone Encounter (Signed)
Refill request for amphetamine-dextroamphetamine (ADDERALL XR) 20 MG 24 hr capsule  LOV - 01/20/21   Next OV - not scheduled Last refill - 09/08/21 #30/0

## 2021-10-11 MED ORDER — AMPHETAMINE-DEXTROAMPHET ER 20 MG PO CP24: 20.0000 mg | ORAL_CAPSULE | ORAL | 0 refills | Status: DC

## 2021-11-12 ENCOUNTER — Other Ambulatory Visit: Payer: Self-pay | Admitting: Family Medicine

## 2021-11-12 NOTE — Telephone Encounter (Signed)
Last office visit 01/20/2021 for CPE.  Last refilled 10/11/2021 for #30 with no refills.  No future appointments.

## 2021-11-13 MED ORDER — AMPHETAMINE-DEXTROAMPHET ER 20 MG PO CP24: 20.0000 mg | ORAL_CAPSULE | ORAL | 0 refills | Status: DC

## 2021-12-11 ENCOUNTER — Other Ambulatory Visit: Payer: Self-pay | Admitting: Family Medicine

## 2021-12-11 NOTE — Telephone Encounter (Signed)
Refill request Adderall Last office visit 01/20/21 Last refill 11/13/21 #30 No upcoming appointment scheduled

## 2021-12-13 MED ORDER — AMPHETAMINE-DEXTROAMPHET ER 20 MG PO CP24: 20.0000 mg | ORAL_CAPSULE | ORAL | 0 refills | Status: DC

## 2021-12-13 NOTE — Telephone Encounter (Signed)
Sent. Thanks.   

## 2021-12-25 ENCOUNTER — Other Ambulatory Visit: Payer: Self-pay | Admitting: Family Medicine

## 2021-12-25 DIAGNOSIS — F988 Other specified behavioral and emotional disorders with onset usually occurring in childhood and adolescence: Secondary | ICD-10-CM

## 2022-01-04 ENCOUNTER — Other Ambulatory Visit (INDEPENDENT_AMBULATORY_CARE_PROVIDER_SITE_OTHER): Payer: 59

## 2022-01-04 DIAGNOSIS — F988 Other specified behavioral and emotional disorders with onset usually occurring in childhood and adolescence: Secondary | ICD-10-CM

## 2022-01-04 LAB — BASIC METABOLIC PANEL
BUN: 15 mg/dL (ref 6–23)
CO2: 27 mEq/L (ref 19–32)
Calcium: 9 mg/dL (ref 8.4–10.5)
Chloride: 101 mEq/L (ref 96–112)
Creatinine, Ser: 1.15 mg/dL (ref 0.40–1.50)
GFR: 85.32 mL/min (ref >60.00)
Glucose, Bld: 95 mg/dL (ref 70–99)
Potassium: 4 mEq/L (ref 3.5–5.1)
Sodium: 138 mEq/L (ref 135–145)

## 2022-01-08 ENCOUNTER — Encounter: Payer: Self-pay | Admitting: Family Medicine

## 2022-01-08 ENCOUNTER — Ambulatory Visit (INDEPENDENT_AMBULATORY_CARE_PROVIDER_SITE_OTHER): Payer: 59 | Admitting: Family Medicine

## 2022-01-08 VITALS — BP 112/70 | HR 74 | Temp 98.3°F | Ht 73.25 in | Wt 225.0 lb

## 2022-01-08 DIAGNOSIS — Z0000 Encounter for general adult medical examination without abnormal findings: Secondary | ICD-10-CM | POA: Diagnosis not present

## 2022-01-08 DIAGNOSIS — F988 Other specified behavioral and emotional disorders with onset usually occurring in childhood and adolescence: Secondary | ICD-10-CM

## 2022-01-08 DIAGNOSIS — Z7189 Other specified counseling: Secondary | ICD-10-CM

## 2022-01-08 MED ORDER — AMPHETAMINE-DEXTROAMPHET ER 20 MG PO CP24: 20.0000 mg | ORAL_CAPSULE | ORAL | 0 refills | Status: DC

## 2022-01-08 NOTE — Patient Instructions (Addendum)
Thanks for your effort.   Update me as needed.  Take care.  Glad to see you. I would get a flu shot each fall.

## 2022-01-08 NOTE — Progress Notes (Unsigned)
CPE- See plan.  Routine anticipatory guidance given to patient.  See health maintenance.  The possibility exists that previously documented standard health maintenance information may have been brought forward from a previous encounter into this note.  If needed, that same information has been updated to reflect the current situation based on today's encounter.    Tetanus 2016 Covid prev done Flu shot to be done this fall.   PNA and shingles not due.  Colon and prostate cancer screening d/w pt.   Mother designated if patient were incapacitated.   Diet and exercise d/w pt.  Working on both.    He has dermatology f/u pending.    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    ADD d/w pt.  He has tolerated adderall, esp with early AM dosing.  It lasts from around 7 AM to 5-6 PM.  No ADE on med.  His concentration if better on med.  No tremor.  No insomnia early early AM dosing.    PMH and SH reviewed  Meds, vitals, and allergies reviewed.   ROS: Per HPI.  Unless specifically indicated otherwise in HPI, the patient denies:  General: fever. Eyes: acute vision changes ENT: sore throat Cardiovascular: chest pain Respiratory: SOB GI: vomiting GU: dysuria Musculoskeletal: acute back pain Derm: acute rash Neuro: acute motor dysfunction Psych: worsening mood Endocrine: polydipsia Heme: bleeding Allergy: hayfever  GEN: nad, alert and oriented HEENT: mucous membranes moist NECK: supple w/o LA CV: rrr. PULM: ctab, no inc wob ABD: soft, +bs EXT: no edema SKIN: no acute rash, old scar noted on left leg

## 2022-01-10 NOTE — Assessment & Plan Note (Signed)
Continue Adderall as is.  He will update me as needed. 

## 2022-01-10 NOTE — Assessment & Plan Note (Signed)
Mother designated if patient were incapacitated. 

## 2022-01-10 NOTE — Assessment & Plan Note (Signed)
Tetanus 2016 Covid prev done Flu shot to be done this fall.   PNA and shingles not due.  Colon and prostate cancer screening d/w pt.   Mother designated if patient were incapacitated.   Diet and exercise d/w pt.  Working on both.    He has dermatology f/u pending.

## 2022-03-12 ENCOUNTER — Other Ambulatory Visit: Payer: Self-pay | Admitting: Family Medicine

## 2022-03-12 NOTE — Telephone Encounter (Signed)
Refill request for AMPHETAMINE-DEXTROAMPHET ER 20 MG C  LOV - 01/08/22 Next OV - not scheduled Last refill - 01/08/22 x 3; needs the next three months sent in

## 2022-03-14 MED ORDER — AMPHETAMINE-DEXTROAMPHET ER 20 MG PO CP24: 20.0000 mg | ORAL_CAPSULE | ORAL | 0 refills | Status: DC

## 2022-03-14 NOTE — Telephone Encounter (Signed)
Sent. Thanks.   

## 2022-04-12 ENCOUNTER — Other Ambulatory Visit: Payer: Self-pay | Admitting: Family Medicine

## 2022-06-14 ENCOUNTER — Other Ambulatory Visit: Payer: Self-pay | Admitting: Family Medicine

## 2022-06-14 NOTE — Telephone Encounter (Signed)
Refill request Adderall Last refill 03/14/22 #30/3 refills Last office visit 01/08/22

## 2022-06-16 MED ORDER — AMPHETAMINE-DEXTROAMPHET ER 20 MG PO CP24: 20.0000 mg | ORAL_CAPSULE | ORAL | 0 refills | Status: DC

## 2022-06-16 NOTE — Telephone Encounter (Signed)
Sent. Thanks.   

## 2022-09-21 ENCOUNTER — Other Ambulatory Visit: Payer: Self-pay | Admitting: Family Medicine

## 2022-09-22 MED ORDER — AMPHETAMINE-DEXTROAMPHET ER 20 MG PO CP24: 20.0000 mg | ORAL_CAPSULE | ORAL | 0 refills | Status: DC

## 2022-09-22 NOTE — Telephone Encounter (Signed)
Refill request for amphetamine-dextroamphetamine (ADDERALL XR) 20 MG 24 hr capsule   LOV - 01/08/22 Next OV - not scheduled Last refill - 06/16/22 #30/0 x 3

## 2022-12-27 ENCOUNTER — Other Ambulatory Visit: Payer: Self-pay | Admitting: Family Medicine

## 2022-12-27 NOTE — Telephone Encounter (Signed)
Refill request for amphetamine-dextroamphetamine (ADDERALL XR) 20 MG 24 hr capsule   LOV - 01/08/22 Next OV - not scheduled Last refill - 09/22/22 #30/0

## 2022-12-28 MED ORDER — AMPHETAMINE-DEXTROAMPHET ER 20 MG PO CP24: 20.0000 mg | ORAL_CAPSULE | ORAL | 0 refills | Status: DC

## 2022-12-28 NOTE — Telephone Encounter (Signed)
Sent. Thanks.   

## 2023-01-27 DIAGNOSIS — D2272 Melanocytic nevi of left lower limb, including hip: Secondary | ICD-10-CM | POA: Diagnosis not present

## 2023-01-27 DIAGNOSIS — R208 Other disturbances of skin sensation: Secondary | ICD-10-CM | POA: Diagnosis not present

## 2023-01-27 DIAGNOSIS — D485 Neoplasm of uncertain behavior of skin: Secondary | ICD-10-CM | POA: Diagnosis not present

## 2023-01-27 DIAGNOSIS — D2239 Melanocytic nevi of other parts of face: Secondary | ICD-10-CM | POA: Diagnosis not present

## 2023-01-27 DIAGNOSIS — D225 Melanocytic nevi of trunk: Secondary | ICD-10-CM | POA: Diagnosis not present

## 2023-01-27 DIAGNOSIS — D2271 Melanocytic nevi of right lower limb, including hip: Secondary | ICD-10-CM | POA: Diagnosis not present

## 2023-01-27 DIAGNOSIS — L814 Other melanin hyperpigmentation: Secondary | ICD-10-CM | POA: Diagnosis not present

## 2023-01-27 DIAGNOSIS — D2261 Melanocytic nevi of right upper limb, including shoulder: Secondary | ICD-10-CM | POA: Diagnosis not present

## 2023-01-27 DIAGNOSIS — D2262 Melanocytic nevi of left upper limb, including shoulder: Secondary | ICD-10-CM | POA: Diagnosis not present

## 2023-03-02 ENCOUNTER — Other Ambulatory Visit: Payer: Self-pay | Admitting: Family Medicine

## 2023-03-03 NOTE — Telephone Encounter (Signed)
Refill request for  amphetamine-dextroamphetamine (ADDERALL XR) 20 MG 24 hr capsule   LOV - 01/08/22 Next OV - not scheduled Last refill - 12/28/22 #30/0 x 3

## 2023-03-03 NOTE — Telephone Encounter (Signed)
Sent with request to schedule.  Thanks.

## 2023-05-02 ENCOUNTER — Other Ambulatory Visit: Payer: Self-pay | Admitting: Family Medicine

## 2023-05-02 NOTE — Telephone Encounter (Signed)
Last office visit:01/08/22 Next office visit: next office visit Last refill:  AMPHETAMINE-DEXTROAMPHET ER 20 MG C 03/03/23

## 2023-05-03 NOTE — Telephone Encounter (Signed)
Please call and schedule yearly visit.  Thanks.

## 2023-05-03 NOTE — Telephone Encounter (Signed)
Patient has been scheduled

## 2023-05-04 ENCOUNTER — Other Ambulatory Visit: Payer: Self-pay | Admitting: Family Medicine

## 2023-05-04 DIAGNOSIS — F988 Other specified behavioral and emotional disorders with onset usually occurring in childhood and adolescence: Secondary | ICD-10-CM

## 2023-05-06 ENCOUNTER — Other Ambulatory Visit (INDEPENDENT_AMBULATORY_CARE_PROVIDER_SITE_OTHER): Payer: 59

## 2023-05-06 DIAGNOSIS — F988 Other specified behavioral and emotional disorders with onset usually occurring in childhood and adolescence: Secondary | ICD-10-CM

## 2023-05-06 LAB — BASIC METABOLIC PANEL
BUN: 15 mg/dL (ref 6–23)
CO2: 30 meq/L (ref 19–32)
Calcium: 9.1 mg/dL (ref 8.4–10.5)
Chloride: 105 meq/L (ref 96–112)
Creatinine, Ser: 0.97 mg/dL (ref 0.40–1.50)
GFR: 103.68 mL/min (ref >60.00)
Glucose, Bld: 95 mg/dL (ref 70–99)
Potassium: 4.3 meq/L (ref 3.5–5.1)
Sodium: 142 meq/L (ref 135–145)

## 2023-05-13 ENCOUNTER — Encounter: Payer: Self-pay | Admitting: Family Medicine

## 2023-05-13 ENCOUNTER — Ambulatory Visit (INDEPENDENT_AMBULATORY_CARE_PROVIDER_SITE_OTHER): Payer: 59 | Admitting: Family Medicine

## 2023-05-13 VITALS — BP 128/68 | HR 75 | Temp 98.0°F | Ht 72.84 in | Wt 235.2 lb

## 2023-05-13 DIAGNOSIS — Z113 Encounter for screening for infections with a predominantly sexual mode of transmission: Secondary | ICD-10-CM

## 2023-05-13 DIAGNOSIS — K602 Anal fissure, unspecified: Secondary | ICD-10-CM

## 2023-05-13 DIAGNOSIS — Z0000 Encounter for general adult medical examination without abnormal findings: Secondary | ICD-10-CM | POA: Diagnosis not present

## 2023-05-13 DIAGNOSIS — F988 Other specified behavioral and emotional disorders with onset usually occurring in childhood and adolescence: Secondary | ICD-10-CM

## 2023-05-13 DIAGNOSIS — Z7189 Other specified counseling: Secondary | ICD-10-CM

## 2023-05-13 NOTE — Patient Instructions (Addendum)
Go to the lab on the way out.   If you have mychart we'll likely use that to update you.    Take care.  Glad to see you. You should get a call about seeing GI.

## 2023-05-13 NOTE — Progress Notes (Unsigned)
CPE- See plan.  Routine anticipatory guidance given to patient.  See health maintenance.  The possibility exists that previously documented standard health maintenance information may have been brought forward from a previous encounter into this note.  If needed, that same information has been updated to reflect the current situation based on today's encounter.    Tetanus 2016 Covid prev done 02/2023 outside of clinic.  Flu shot done 02/2023 outside of clinic.  PNA and shingles not due.  Colon and prostate cancer screening d/w pt.   Mother designated if patient were incapacitated.   Diet and exercise d/w pt.  Working on both.    D/w pt about STD testing.  See orders.  No symptoms.    ADD.  No tremor.  Mood is good.  Work is steady and manageable.  No ADE on med.  It helped.  Compliant.    He got two puppies, they had hookworm.  He got them treated and rechecked.  He doesn't have h/o visible parasites in stool, d/w pt.    He had h/o occ blood in stool with hard BM- that was going on intermittently for years. D/w pt about presumed rectal fissure- he has had pain with the events.  He can go weeks to months between events.  H/o firm and large BMs with symptoms.  Already taking a fiber supplement.    PMH and SH reviewed  Meds, vitals, and allergies reviewed.   ROS: Per HPI.  Unless specifically indicated otherwise in HPI, the patient denies:  General: fever. Eyes: acute vision changes ENT: sore throat Cardiovascular: chest pain Respiratory: SOB GI: vomiting GU: dysuria Musculoskeletal: acute back pain Derm: acute rash Neuro: acute motor dysfunction Psych: worsening mood Endocrine: polydipsia Heme: bleeding Allergy: hayfever  GEN: nad, alert and oriented HEENT: mucous membranes moist NECK: supple w/o LA CV: rrr. PULM: ctab, no inc wob ABD: soft, +bs EXT: no edema SKIN: no acute rash Small residual superior rectal fissure w/o hemorrhoid or gross blood seen.

## 2023-05-15 DIAGNOSIS — K602 Anal fissure, unspecified: Secondary | ICD-10-CM | POA: Insufficient documentation

## 2023-05-15 LAB — RPR: RPR Ser Ql: NONREACTIVE

## 2023-05-15 LAB — HIV ANTIBODY (ROUTINE TESTING W REFLEX): HIV 1&2 Ab, 4th Generation: NONREACTIVE

## 2023-05-15 LAB — C. TRACHOMATIS/N. GONORRHOEAE RNA
C. trachomatis RNA, TMA: NOT DETECTED
N. gonorrhoeae RNA, TMA: NOT DETECTED

## 2023-05-15 NOTE — Assessment & Plan Note (Signed)
Continue adderall use as is. See above.

## 2023-05-15 NOTE — Assessment & Plan Note (Signed)
Tetanus 2016 Covid prev done 02/2023 outside of clinic.  Flu shot done 02/2023 outside of clinic.  PNA and shingles not due.  Colon and prostate cancer screening d/w pt.   Mother designated if patient were incapacitated.   Diet and exercise d/w pt.  Working on both.    D/w pt about STD testing.  See orders.  No symptoms.

## 2023-05-15 NOTE — Assessment & Plan Note (Signed)
Mother designated if patient were incapacitated. 

## 2023-05-15 NOTE — Assessment & Plan Note (Signed)
Recurrent, going on intermittently for years.  Discussed options with topical treatment and/or seeing GI.  Opts for seeing GI.  Referral placed.  Already on fiber supplement.

## 2023-06-22 DIAGNOSIS — R208 Other disturbances of skin sensation: Secondary | ICD-10-CM | POA: Diagnosis not present

## 2023-06-22 DIAGNOSIS — L538 Other specified erythematous conditions: Secondary | ICD-10-CM | POA: Diagnosis not present

## 2023-06-22 DIAGNOSIS — B078 Other viral warts: Secondary | ICD-10-CM | POA: Diagnosis not present

## 2023-07-07 ENCOUNTER — Ambulatory Visit: Payer: 59 | Admitting: Gastroenterology

## 2023-07-07 VITALS — BP 130/77 | HR 76 | Temp 98.1°F | Ht 73.0 in | Wt 233.0 lb

## 2023-07-07 DIAGNOSIS — R195 Other fecal abnormalities: Secondary | ICD-10-CM

## 2023-07-07 DIAGNOSIS — K921 Melena: Secondary | ICD-10-CM

## 2023-07-07 DIAGNOSIS — K602 Anal fissure, unspecified: Secondary | ICD-10-CM

## 2023-07-07 MED ORDER — NA SULFATE-K SULFATE-MG SULF 17.5-3.13-1.6 GM/177ML PO SOLN: 1.0000 | Freq: Once | ORAL | 0 refills | Status: AC
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
## 2023-07-07 NOTE — Addendum Note (Signed)
Addended by: Roena Malady on: 07/07/2023 01:32 PM   Modules accepted: Orders

## 2023-07-07 NOTE — Patient Instructions (Signed)
The ointment will be sent to [facility] [address] [place] [address].   Phone number [phone].   Please allow at least 24 hours before picking up the compounded cream because the pharmacy has to make the medication.

## 2023-07-07 NOTE — Addendum Note (Signed)
Addended by: Roena Malady on: 07/07/2023 01:45 PM   Modules accepted: Orders

## 2023-07-19 ENCOUNTER — Encounter: Payer: Self-pay | Admitting: Gastroenterology

## 2023-07-25 ENCOUNTER — Telehealth: Payer: Self-pay | Admitting: Gastroenterology

## 2023-07-25 ENCOUNTER — Other Ambulatory Visit: Payer: Self-pay

## 2023-07-25 MED ORDER — NA SULFATE-K SULFATE-MG SULF 17.5-3.13-1.6 GM/177ML PO SOLN: 354.0000 mL | Freq: Once | ORAL | 0 refills | Status: AC

## 2023-07-25 NOTE — Telephone Encounter (Signed)
 The patient called back to schedule his procedure.

## 2023-07-25 NOTE — Telephone Encounter (Signed)
 The patient called in to get the cost of his procedure and he wants to know where his prescription was sent.

## 2023-07-25 NOTE — Telephone Encounter (Signed)
 Called patient back and let him know that his prescription was sent to [facility] pharmacy once again today and I also provided him with pre-service phone number to call and ask them what would be the cost of his procedure. Patient had no further questions. Pre-service [phone]

## 2023-07-26 ENCOUNTER — Ambulatory Visit: Admission: RE | Admit: 2023-07-26 | Payer: 59 | Source: Home / Self Care | Admitting: Gastroenterology

## 2023-07-26 SURGERY — COLONOSCOPY WITH PROPOFOL
Anesthesia: General

## 2023-07-26 NOTE — Telephone Encounter (Signed)
 Left message on voicemail.

## 2023-08-02 NOTE — Telephone Encounter (Signed)
 Sent message to patient via MyChart so he could choose a date to reschedule his procedure.

## 2023-08-15 ENCOUNTER — Other Ambulatory Visit: Payer: Self-pay | Admitting: Family Medicine

## 2023-08-15 NOTE — Telephone Encounter (Signed)
 AMPHETAMINE-DEXTROAMPHET ER 20 MG  Last office visit:05/13/23 CPE Next office visit: next office visit Last refill: 05/03/23 #30 X3 rxs

## 2023-08-16 MED ORDER — AMPHETAMINE-DEXTROAMPHET ER 20 MG PO CP24: 20.0000 mg | ORAL_CAPSULE | Freq: Every morning | ORAL | 0 refills | Status: DC

## 2023-11-15 ENCOUNTER — Other Ambulatory Visit: Payer: Self-pay | Admitting: Family Medicine

## 2023-11-15 NOTE — Telephone Encounter (Signed)
 LOV: 05/13/23 WNC:WNUYPWH SCHEDULED   LAST REFILL: AMPHETAMINE -DEXTROAMPHET ER 20 MG C 08/16/23 30 capsules 0 refills

## 2023-11-16 MED ORDER — AMPHETAMINE-DEXTROAMPHET ER 20 MG PO CP24: 20.0000 mg | ORAL_CAPSULE | Freq: Every morning | ORAL | 0 refills | Status: DC

## 2023-11-16 NOTE — Telephone Encounter (Signed)
 Sent. Thanks.

## 2024-01-30 DIAGNOSIS — D2271 Melanocytic nevi of right lower limb, including hip: Secondary | ICD-10-CM | POA: Diagnosis not present

## 2024-01-30 DIAGNOSIS — D2261 Melanocytic nevi of right upper limb, including shoulder: Secondary | ICD-10-CM | POA: Diagnosis not present

## 2024-01-30 DIAGNOSIS — L821 Other seborrheic keratosis: Secondary | ICD-10-CM | POA: Diagnosis not present

## 2024-01-30 DIAGNOSIS — D225 Melanocytic nevi of trunk: Secondary | ICD-10-CM | POA: Diagnosis not present

## 2024-01-30 DIAGNOSIS — L708 Other acne: Secondary | ICD-10-CM | POA: Diagnosis not present

## 2024-01-30 DIAGNOSIS — D2262 Melanocytic nevi of left upper limb, including shoulder: Secondary | ICD-10-CM | POA: Diagnosis not present

## 2024-01-30 DIAGNOSIS — L570 Actinic keratosis: Secondary | ICD-10-CM | POA: Diagnosis not present

## 2024-01-30 DIAGNOSIS — D2272 Melanocytic nevi of left lower limb, including hip: Secondary | ICD-10-CM | POA: Diagnosis not present

## 2024-01-30 DIAGNOSIS — K116 Mucocele of salivary gland: Secondary | ICD-10-CM | POA: Diagnosis not present

## 2024-02-23 ENCOUNTER — Other Ambulatory Visit: Payer: Self-pay | Admitting: Family Medicine

## 2024-02-23 NOTE — Telephone Encounter (Signed)
 LOV: 05/13/23 WNC:WNUYPWH SCHEDULED   LAST REFILL: AMPHETAMINE -DEXTROAMPHET ER 20 MG C 08/16/23 30 capsules 0 refills

## 2024-02-24 MED ORDER — AMPHETAMINE-DEXTROAMPHET ER 20 MG PO CP24: 20.0000 mg | ORAL_CAPSULE | Freq: Every morning | ORAL | 0 refills | Status: DC

## 2024-03-27 ENCOUNTER — Other Ambulatory Visit: Payer: Self-pay | Admitting: Family Medicine

## 2024-03-27 DIAGNOSIS — F988 Other specified behavioral and emotional disorders with onset usually occurring in childhood and adolescence: Secondary | ICD-10-CM

## 2024-03-27 NOTE — Telephone Encounter (Signed)
 Copied from CRM [id number]. Topic: Clinical - Medication Refill >> Mar 27, 2024  3:13 PM Mesmerise C wrote: Medication: amphetamine -dextroamphetamine  (ADDERALL XR) 20 MG 24 hr capsule  Has the patient contacted their pharmacy? No (Agent: If no, request that the patient contact the pharmacy for the refill. If patient does not wish to contact the pharmacy document the reason why and proceed with request.) (Agent: If yes, when and what did the pharmacy advise?)  This is the patient's preferred pharmacy:   [facility] - [place], [place] - [address] [place] [address] Phone: [phone] Fax: [phone]  Is this the correct pharmacy for this prescription? Yes If no, delete pharmacy and type the correct one.   Has the prescription been filled recently? No  Is the patient out of the medication? Yes  Has the patient been seen for an appointment in the last year OR does the patient have an upcoming appointment? Yes  Can we respond through MyChart? Yes  Agent: Please be advised that Rx refills may take up to 3 business days. We ask that you follow-up with your pharmacy.

## 2024-03-28 ENCOUNTER — Telehealth: Payer: Self-pay

## 2024-03-28 ENCOUNTER — Other Ambulatory Visit (HOSPITAL_COMMUNITY): Payer: Self-pay

## 2024-03-28 MED ORDER — AMPHETAMINE-DEXTROAMPHET ER 10 MG PO CP24: 20.0000 mg | ORAL_CAPSULE | Freq: Every day | ORAL | 0 refills | Status: DC

## 2024-03-28 NOTE — Telephone Encounter (Signed)
 Pharmacy Patient Advocate Encounter   Received notification from Onbase that prior authorization for Adderall XR 20 ER caps is required/requested.   Insurance verification completed.   The patient is insured through [employer]   Spoke with RPH at patient pharmacy ([facility]). There is a long term backorder for Adderall XR 20 caps. Verified that they have inventory of the 10 mg caps. Ran test claim for Adderall XR 10 mg Caps at 60 bid 30 days and patient copay is $25.00. Due to the nature of nationwide backorder, cannot maintain past this 30 day fill. Please advise

## 2024-03-28 NOTE — Telephone Encounter (Signed)
 Sent rx for 10mg  tabs, to take 2 per day.   What does the pharmacy have in stock as a reasonable substitution going forward?  Please let me know.    Please make sure patient is aware about this.    Thanks.

## 2024-03-29 NOTE — Telephone Encounter (Signed)
 Left voicemail to call office back to go over results.

## 2024-04-02 NOTE — Telephone Encounter (Signed)
 Adderall XR 20 mg d/c on 03/28/24 by Dr Cleatus (see phn note). New rx sent for Adderall XR 10 mg, 03/28/24, #60/0 refills to [facility].   Last OV:  05/13/23, CPE Next OV:  none

## 2024-04-03 NOTE — Telephone Encounter (Signed)
 Per EMR, he had the 10mg  rx filled.  Can he get the 20mg  tabs at the pharmacy?

## 2024-04-11 MED ORDER — AMPHETAMINE-DEXTROAMPHET ER 20 MG PO CP24: 20.0000 mg | ORAL_CAPSULE | Freq: Every morning | ORAL | 0 refills | Status: DC

## 2024-04-11 NOTE — Telephone Encounter (Signed)
 Spoke with Samuel Bernard asking if Adderall XR 10 mg rx was picked up and when. She confirmed rx was picked up on 03/28/24.   I then asked about the 20 mg rx. She pt would not be able to fill a 20 mg rx now since he just picked up 10 mg. However, says pt can take 2- 10 mg caps and a new rx can be sent in 15 days for 20 mg rx.

## 2024-04-11 NOTE — Telephone Encounter (Signed)
 I sent rx for when the current rx is completed.  Thanks.

## 2024-04-12 NOTE — Telephone Encounter (Signed)
 Lvm asking pt to call back. Pls relay Dr Elfredia message about Adderall XR change due to 20 mg cap on backorder right now.   Fyi to Dr Cleatus, I spoke with Amy at [facility] about the backorder and a reasonable substitution. Told the new rx you sent for 10 mg- Take 2 caps daily is probably the best option for now, as long as pt's insurance is okay with it. Says insurance approved the recent rx sent in.

## 2024-04-12 NOTE — Telephone Encounter (Unsigned)
 Copied from CRM [id number]. Topic: General - Call Back - No Documentation >> Apr 11, 2024  3:37 PM Winona R wrote: Pt returning office call- no documentation. He states it was in regards to his medication refill request sent on 11/04 for amphetamine -dextroamphetamine  (ADDERALL XR) 10 MG 24 hr capsule

## 2024-04-13 NOTE — Telephone Encounter (Signed)
 Lvm asking pt to call back. Pls relay Dr Elfredia message about Adderall XR change due to 20 mg cap on backorder right now.

## 2024-04-13 NOTE — Telephone Encounter (Signed)
Noted. Thanks.  Please update patient.   

## 2024-04-16 NOTE — Telephone Encounter (Signed)
 Lvm asking pt to call back. Pls relay Dr Elfredia message about Adderall XR change due to 20 mg cap on backorder right now.

## 2024-04-17 NOTE — Telephone Encounter (Signed)
 Lvm asking pt to call back. Pls relay Dr Elfredia message about Adderall XR change due to 20 mg cap on backorder right now.   Mailing a letter.

## 2024-04-30 ENCOUNTER — Other Ambulatory Visit: Payer: Self-pay | Admitting: Family Medicine

## 2024-04-30 NOTE — Telephone Encounter (Signed)
 Last OV: 05/13/23 Pending OV: 05/14/24 Medication: Adderall XR 10mg  Directions: 2 capsules daily Last Refill: 03/28/24 Qty: #60 with 0 refills

## 2024-05-14 ENCOUNTER — Encounter: Admitting: Family Medicine

## 2024-05-31 ENCOUNTER — Encounter: Admitting: Family Medicine

## 2024-06-08 ENCOUNTER — Ambulatory Visit: Admitting: Family Medicine

## 2024-06-08 VITALS — BP 118/84 | HR 76 | Temp 98.6°F | Ht 72.75 in | Wt 232.6 lb

## 2024-06-08 DIAGNOSIS — Z113 Encounter for screening for infections with a predominantly sexual mode of transmission: Secondary | ICD-10-CM

## 2024-06-08 DIAGNOSIS — K921 Melena: Secondary | ICD-10-CM

## 2024-06-08 DIAGNOSIS — F988 Other specified behavioral and emotional disorders with onset usually occurring in childhood and adolescence: Secondary | ICD-10-CM

## 2024-06-08 DIAGNOSIS — Z0000 Encounter for general adult medical examination without abnormal findings: Secondary | ICD-10-CM

## 2024-06-08 DIAGNOSIS — Z23 Encounter for immunization: Secondary | ICD-10-CM

## 2024-06-08 DIAGNOSIS — Z7189 Other specified counseling: Secondary | ICD-10-CM

## 2024-06-08 MED ORDER — AMPHETAMINE-DEXTROAMPHET ER 10 MG PO CP24: 20.0000 mg | ORAL_CAPSULE | Freq: Every day | ORAL | 0 refills | Status: AC

## 2024-06-08 NOTE — Patient Instructions (Addendum)
 Go to the lab on the way out.   If you have mychart we'll likely use that to update you.    Take care.  Glad to see you. Let me know if you can't get set up with Dr. Therisa.

## 2024-06-08 NOTE — Progress Notes (Signed)
 CPE- See plan.  Routine anticipatory guidance given to patient.  See health maintenance.  The possibility exists that previously documented standard health maintenance information may have been brought forward from a previous encounter into this note.  If needed, that same information has been updated to reflect the current situation based on today's encounter.    Tetanus 2026 Covid prev done 02/2023 outside of clinic.  Flu shot done 02/2024 PNA and shingles not due.  Colon and prostate cancer screening d/w pt.   Mother designated if patient were incapacitated.   Diet and exercise d/w pt.  Working on both.     D/w pt about STD testing.  See orders.  No symptoms.     ADD.  No tremor.  Mood is good.  Work is manageable.  No ADE on med.  It helped.  Compliant.  No insomnia.    He couldn't get colonoscopy covered.  He had h/o blood in stool.  D/w pt about referral back to GI.  Referral placed.  PMH and SH reviewed  Meds, vitals, and allergies reviewed.   ROS: Per HPI.  Unless specifically indicated otherwise in HPI, the patient denies:  General: fever. Eyes: acute vision changes ENT: sore throat Cardiovascular: chest pain Respiratory: SOB GI: vomiting GU: dysuria Musculoskeletal: acute back pain Derm: acute rash Neuro: acute motor dysfunction Psych: worsening mood Endocrine: polydipsia Heme: bleeding Allergy: hayfever  GEN: nad, alert and oriented HEENT: mucous membranes moist NECK: supple w/o LA CV: rrr. PULM: ctab, no inc wob ABD: soft, +bs EXT: no edema SKIN: no acute rash

## 2024-06-09 LAB — HIV ANTIBODY (ROUTINE TESTING W REFLEX)
HIV 1&2 Ab, 4th Generation: NONREACTIVE
HIV FINAL INTERPRETATION: NEGATIVE

## 2024-06-09 LAB — BASIC METABOLIC PANEL WITH GFR
BUN: 12 mg/dL (ref 7–25)
CO2: 27 mmol/L (ref 20–32)
Calcium: 9.3 mg/dL (ref 8.6–10.3)
Chloride: 105 mmol/L (ref 98–110)
Creat: 1 mg/dL (ref 0.60–1.26)
Glucose, Bld: 98 mg/dL (ref 65–99)
Potassium: 4.2 mmol/L (ref 3.5–5.3)
Sodium: 140 mmol/L (ref 135–146)
eGFR: 102 mL/min/1.73m2

## 2024-06-09 LAB — C. TRACHOMATIS/N. GONORRHOEAE RNA
C. trachomatis RNA, TMA: NOT DETECTED
N. gonorrhoeae RNA, TMA: NOT DETECTED

## 2024-06-09 LAB — SYPHILIS: RPR W/REFLEX TO RPR TITER AND TREPONEMAL ANTIBODIES, TRADITIONAL SCREENING AND DIAGNOSIS ALGORITHM: RPR Ser Ql: NONREACTIVE

## 2024-06-10 ENCOUNTER — Ambulatory Visit: Payer: Self-pay | Admitting: Family Medicine

## 2024-06-10 DIAGNOSIS — K921 Melena: Secondary | ICD-10-CM | POA: Insufficient documentation

## 2024-06-10 NOTE — Assessment & Plan Note (Signed)
 History of.  Referred back to GI.

## 2024-06-10 NOTE — Assessment & Plan Note (Signed)
 No tremor.  Mood is good.  Work is manageable.  No ADE on med.  It helped.  Compliant.  No insomnia.   Continue as is with Adderall.

## 2024-06-10 NOTE — Assessment & Plan Note (Signed)
 Tetanus 2026 Covid prev done 02/2023 outside of clinic.  Flu shot done 02/2024 PNA and shingles not due.  Colon and prostate cancer screening d/w pt.   Mother designated if patient were incapacitated.   Diet and exercise d/w pt.  Working on both.

## 2024-06-10 NOTE — Assessment & Plan Note (Signed)
Mother designated if patient were incapacitated. 

## 2024-06-12 ENCOUNTER — Other Ambulatory Visit

## 2024-06-19 ENCOUNTER — Encounter: Admitting: Family Medicine
